# Patient Record
Sex: Female | Born: 2000 | Hispanic: No | Marital: Single | Smoking: Current every day smoker
Health system: Southern US, Community
[De-identification: ages and names within clinical notes are randomized; demographics above are authoritative.]

---
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
## 2018-01-28 ENCOUNTER — Ambulatory Visit (INDEPENDENT_AMBULATORY_CARE_PROVIDER_SITE_OTHER): Payer: Managed Care, Other (non HMO)

## 2018-01-28 ENCOUNTER — Encounter (HOSPITAL_COMMUNITY): Payer: Self-pay | Admitting: Emergency Medicine

## 2018-01-28 ENCOUNTER — Ambulatory Visit (HOSPITAL_COMMUNITY)
Admission: EM | Admit: 2018-01-28 | Discharge: 2018-01-28 | Disposition: A | Discharge status: Home / Self Care | Payer: Managed Care, Other (non HMO) | Attending: Family Medicine | Admitting: Family Medicine

## 2018-01-28 ENCOUNTER — Ambulatory Visit: Payer: Self-pay

## 2018-01-28 DIAGNOSIS — K5900 Constipation, unspecified: Secondary | ICD-10-CM | POA: Diagnosis not present

## 2018-01-28 MED ORDER — MAGNESIUM CITRATE PO SOLN: 1.0000 | Freq: Once | ORAL | 1 refills | Status: AC

## 2018-01-28 NOTE — ED Provider Notes (Signed)
[facility]    CSN: [id number] Arrival date & time: 01/28/18  1502     History   Chief Complaint Chief Complaint  Patient presents with  . Abdominal Pain    HPI Roberta Mills is a 17 y.o. female.   Patient is a 18 year old female that presents with worsening right upper quadrant pain over the past week.  She has had some associated nausea and one episode of diarrhea this morning.  The diarrhea was soft and semi-liquidy.  She denies any rectal bleeding.  She has had a hard time having a bowel movement over the past week.  The pain is there constantly and it is hard for her to get comfortable.  She denies any vomiting.  Reports some fever and chills this past weekend.   ROS per HPI      History reviewed. No pertinent past medical history.  There are no active problems to display for this patient.   History reviewed. No pertinent surgical history.  OB History   None      Home Medications    Prior to Admission medications   Medication Sig Start Date End Date Taking? Authorizing Provider  cephALEXin (KEFLEX) 500 MG capsule Take 1 capsule (500 mg total) by mouth 4 (four) times daily. Patient not taking: Reported on 01/28/2018 12/25/17   Janace Aris, NP    Family History Family History  Problem Relation Age of Onset  . Healthy Mother   . Healthy Father     Social History Social History   Tobacco Use  . Smoking status: Current Every Day Smoker  . Smokeless tobacco: Current User  Substance Use Topics  . Alcohol use: Yes  . Drug use: Never     Allergies   Peanut oil and Shellfish allergy   Review of Systems Review of Systems   Physical Exam Triage Vital Signs ED Triage Vitals [01/28/18 1548]  Enc Vitals Group     BP 125/66     Pulse Rate 60     Resp 18     Temp 97.8 F (36.6 C)     Temp src      SpO2 100 %     Weight      Height      Head Circumference      Peak Flow      Pain Score      Pain Loc      Pain Edu?      Excl. in  GC?    No data found.  Updated Vital Signs BP 125/66   Pulse 60   Temp 97.8 F (36.6 C)   Resp 18   LMP 01/07/2018   SpO2 100%   Visual Acuity Right Eye Distance:   Left Eye Distance:   Bilateral Distance:    Right Eye Near:   Left Eye Near:    Bilateral Near:     Physical Exam  Constitutional: She is oriented to person, place, and time. She appears well-developed and well-nourished.  Very pleasant. Non toxic or ill appearing.     HENT:  Head: Normocephalic and atraumatic.  Cardiovascular: Normal rate and regular rhythm.  Pulmonary/Chest: Effort normal.  Abdominal: Soft. Normal appearance. Bowel sounds are decreased. There is no hepatosplenomegaly, splenomegaly or hepatomegaly. There is tenderness in the right upper quadrant. There is no rigidity, no rebound, no guarding, no CVA tenderness, no tenderness at McBurney's point and negative Murphy's sign. No hernia.  Very tender to palpation of right upper quadrant  and right mid abdomen.  No right lower quadrant pain or rebound tenderness.  No obvious masses  Neurological: She is alert and oriented to person, place, and time.  Skin: Skin is warm and dry.  Psychiatric: She has a normal mood and affect. Her behavior is normal.  Nursing note and vitals reviewed.    UC Treatments / Results  Labs (all labs ordered are listed, but only abnormal results are displayed) Labs Reviewed - No data to display  EKG None  Radiology Dg Abd 1 View  Result Date: 01/28/2018 CLINICAL DATA:  Right upper quadrant abdominal pain since this morning with diarrhea. EXAM: ABDOMEN - 1 VIEW COMPARISON:  None. FINDINGS: Normal bowel gas pattern. Left pelvic phleboliths. Mild dextroconvex thoracolumbar scoliosis. IMPRESSION: No acute abnormality. Electronically Signed   By: Beckie Salts M.D.   On: 01/28/2018 17:04    Procedures Procedures (including critical care time)  Medications Ordered in UC Medications - No data to display  Initial  Impression / Assessment and Plan / UC Course  I have reviewed the triage vital signs and the nursing notes.  Pertinent labs & imaging results that were available during my care of the patient were reviewed by me and considered in my medical decision making (see chart for details).     X-ray revealed constipation. Will treat with mag citrate. Follow up as needed for continued or worsening symptoms  Final Clinical Impressions(s) / UC Diagnoses   Final diagnoses:  Constipation, unspecified constipation type     Discharge Instructions     It was nice meeting you!!  Your x ray showed constipation .  I am prescribing magnesium citrate for your symptoms. Follow up as needed for continued or worsening symptoms     ED Prescriptions    Medication Sig Dispense Auth. Provider   magnesium citrate SOLN Take 296 mLs (1 Bottle total) by mouth once for 1 dose. 195 mL Dahlia Byes A, NP     Controlled Substance Prescriptions [place] Controlled Substance Registry consulted? Not Applicable   Janace Aris, NP 01/29/18 1133    Isa Rankin, MD 02/10/18 1531

## 2018-01-28 NOTE — Telephone Encounter (Signed)
  Pt called with C/O rt side abdominal pain that she rates as moderate. Hx given is that  she has had some off and on symptoms for about a week. Today pain is constant and gets worse with movement. Pt has had a BM today which she called normal but trying to be diarrhea. No other BM today. She denies vomiting but dose say see has nausea. She was directed to go to the urgent care of choice to be seen today. Pt agreed to disposition. Care advice given Pt verbalized understanding of all.  Reason for Disposition . [1] MILD-MODERATE pain AND [2] constant AND [3] present > 2 hours    Established care visit scheduled 02/11/18  Pt will go to urgent care for evaluation and treatment of her symptoms.  Answer Assessment - Initial Assessment Questions 1. LOCATION: "Where does it hurt?"      Rt side over ribs 2. RADIATION: "Does the pain shoot anywhere else?" (e.g., chest, back)     no 3. ONSET: "When did the pain begin?" (e.g., minutes, hours or days ago)      Yesterday severe off and on for 1 week 4. SUDDEN: "Gradual or sudden onset?"     Sudden went to severe 5. PATTERN "Does the pain come and go, or is it constant?"    - If constant: "Is it getting better, staying the same, or worsening?"      (Note: Constant means the pain never goes away completely; most serious pain is constant and it progresses)     - If intermittent: "How long does it last?" "Do you have pain now?"     (Note: Intermittent means the pain goes away completely between bouts)     worse with movement 6. SEVERITY: "How bad is the pain?"  (e.g., Scale 1-10; mild, moderate, or severe)   - MILD (1-3): doesn't interfere with normal activities, abdomen soft and not tender to touch    - MODERATE (4-7): interferes with normal activities or awakens from sleep, tender to touch    - SEVERE (8-10): excruciating pain, doubled over, unable to do any normal activities     Moderate hurts with walking 7. RECURRENT SYMPTOM: "Have you ever had this type  of abdominal pain before?" If so, ask: "When was the last time?" and "What happened that time?"      no 8. CAUSE: "What do you think is causing the abdominal pain?    unknown 9. RELIEVING/AGGRAVATING FACTORS: "What makes it better or worse?" (e.g., movement, antacids, bowel movement)     This am BM normal no relief reported from pain 10. OTHER SYMPTOMS: "Has there been any vomiting, diarrhea, constipation, or urine problems?"       Feels nauseated 11. PREGNANCY: "Is there any chance you are pregnant?" "When was your last menstrual period?"       3 weeks ago Denies pregnancy  Protocols used: ABDOMINAL PAIN - [facility]

## 2018-01-28 NOTE — ED Triage Notes (Signed)
Pt c/o RUQ abdominal pain x1 week. Had some diarrhea this morning.

## 2018-01-28 NOTE — Discharge Instructions (Signed)
It was nice meeting you!!  Your x ray showed constipation .  I am prescribing magnesium citrate for your symptoms. Follow up as needed for continued or worsening symptoms

## 2018-02-11 ENCOUNTER — Ambulatory Visit: Payer: 59 | Admitting: Emergency Medicine

## 2018-02-11 ENCOUNTER — Encounter: Payer: Self-pay | Admitting: Emergency Medicine

## 2018-02-11 VITALS — BP 136/76 | HR 62 | Temp 97.9°F | Resp 16 | Ht 67.0 in | Wt 171.0 lb

## 2018-02-11 DIAGNOSIS — F329 Major depressive disorder, single episode, unspecified: Secondary | ICD-10-CM

## 2018-02-11 DIAGNOSIS — Z0000 Encounter for general adult medical examination without abnormal findings: Secondary | ICD-10-CM

## 2018-02-11 DIAGNOSIS — Z23 Encounter for immunization: Secondary | ICD-10-CM

## 2018-02-11 DIAGNOSIS — R4589 Other symptoms and signs involving emotional state: Secondary | ICD-10-CM

## 2018-02-11 NOTE — Patient Instructions (Addendum)
If you have lab work done today you will be contacted with your lab results within the next 2 weeks.  If you have not heard from [place] then please contact us. The fastest way to get your results is to register for My Chart.   IF you received an x-ray today, you will receive an invoice from [facility]. Please contact [facility] at [phone] with questions or concerns regarding your invoice.   IF you received labwork today, you will receive an invoice from [place]. Please contact [facility] at [phone] with questions or concerns regarding your invoice.   Our billing staff will not be able to assist you with questions regarding bills from these companies.  You will be contacted with the lab results as soon as they are available. The fastest way to get your results is to activate your My Chart account. Instructions are located on the last page of this paperwork. If you have not heard from [place] regarding the results in 2 weeks, please contact this office.     Health Maintenance, Female Adopting a healthy lifestyle and getting preventive care can go a long way to promote health and wellness. Talk with your health care provider about what schedule of regular examinations is right for you. This is a good chance for you to check in with your provider about disease prevention and staying healthy. In between checkups, there are plenty of things you can do on your own. Experts have done a lot of research about which lifestyle changes and preventive measures are most likely to keep you healthy. Ask your health care provider for more information. Weight and diet Eat a healthy diet  Be sure to include plenty of vegetables, fruits, low-fat dairy products, and lean protein.  Do not eat a lot of foods high in solid fats, added sugars, or salt.  Get regular exercise. This is one of the most important things you can do for your health. ? Most adults should exercise for at least 150  minutes each week. The exercise should increase your heart rate and make you sweat (moderate-intensity exercise). ? Most adults should also do strengthening exercises at least twice a week. This is in addition to the moderate-intensity exercise.  Maintain a healthy weight  Body mass index (BMI) is a measurement that can be used to identify possible weight problems. It estimates body fat based on height and weight. Your health care provider can help determine your BMI and help you achieve or maintain a healthy weight.  For females 22 years of age and older: ? A BMI below 18.5 is considered underweight. ? A BMI of 18.5 to 24.9 is normal. ? A BMI of 25 to 29.9 is considered overweight. ? A BMI of 30 and above is considered obese.  Watch levels of cholesterol and blood lipids  You should start having your blood tested for lipids and cholesterol at 19 years of age, then have this test every 5 years.  You may need to have your cholesterol levels checked more often if: ? Your lipid or cholesterol levels are high. ? You are older than 19 years of age. ? You are at high risk for heart disease.  Cancer screening Lung Cancer  Lung cancer screening is recommended for adults 58-34 years old who are at high risk for lung cancer because of a history of smoking.  A yearly low-dose CT scan of the lungs is recommended for people who: ? Currently smoke. ? Have quit  within the past 15 years. ? Have at least a 30-pack-year history of smoking. A pack year is smoking an average of one pack of cigarettes a day for 1 year.  Yearly screening should continue until it has been 15 years since you quit.  Yearly screening should stop if you develop a health problem that would prevent you from having lung cancer treatment.  Breast Cancer  Practice breast self-awareness. This means understanding how your breasts normally appear and feel.  It also means doing regular breast self-exams. Let your health care  provider know about any changes, no matter how small.  If you are in your 20s or 30s, you should have a clinical breast exam (CBE) by a health care provider every 1-3 years as part of a regular health exam.  If you are 48 or older, have a CBE every year. Also consider having a breast X-ray (mammogram) every year.  If you have a family history of breast cancer, talk to your health care provider about genetic screening.  If you are at high risk for breast cancer, talk to your health care provider about having an MRI and a mammogram every year.  Breast cancer gene (BRCA) assessment is recommended for women who have family members with BRCA-related cancers. BRCA-related cancers include: ? Breast. ? Ovarian. ? Tubal. ? Peritoneal cancers.  Results of the assessment will determine the need for genetic counseling and BRCA1 and BRCA2 testing.  Cervical Cancer Your health care provider may recommend that you be screened regularly for cancer of the pelvic organs (ovaries, uterus, and vagina). This screening involves a pelvic examination, including checking for microscopic changes to the surface of your cervix (Pap test). You may be encouraged to have this screening done every 3 years, beginning at age 8.  For women ages 8-65, health care providers may recommend pelvic exams and Pap testing every 3 years, or they may recommend the Pap and pelvic exam, combined with testing for human papilloma virus (HPV), every 5 years. Some types of HPV increase your risk of cervical cancer. Testing for HPV may also be done on women of any age with unclear Pap test results.  Other health care providers may not recommend any screening for nonpregnant women who are considered low risk for pelvic cancer and who do not have symptoms. Ask your health care provider if a screening pelvic exam is right for you.  If you have had past treatment for cervical cancer or a condition that could lead to cancer, you need Pap tests  and screening for cancer for at least 20 years after your treatment. If Pap tests have been discontinued, your risk factors (such as having a new sexual partner) need to be reassessed to determine if screening should resume. Some women have medical problems that increase the chance of getting cervical cancer. In these cases, your health care provider may recommend more frequent screening and Pap tests.  Colorectal Cancer  This type of cancer can be detected and often prevented.  Routine colorectal cancer screening usually begins at 19 years of age and continues through 19 years of age.  Your health care provider may recommend screening at an earlier age if you have risk factors for colon cancer.  Your health care provider may also recommend using home test kits to check for hidden blood in the stool.  A small camera at the end of a tube can be used to examine your colon directly (sigmoidoscopy or colonoscopy). This is done to check  for the earliest forms of colorectal cancer.  Routine screening usually begins at age 75.  Direct examination of the colon should be repeated every 5-10 years through 19 years of age. However, you may need to be screened more often if early forms of precancerous polyps or small growths are found.  Skin Cancer  Check your skin from head to toe regularly.  Tell your health care provider about any new moles or changes in moles, especially if there is a change in a mole's shape or color.  Also tell your health care provider if you have a mole that is larger than the size of a pencil eraser.  Always use sunscreen. Apply sunscreen liberally and repeatedly throughout the day.  Protect yourself by wearing long sleeves, pants, a wide-brimmed hat, and sunglasses whenever you are outside.  Heart disease, diabetes, and high blood pressure  High blood pressure causes heart disease and increases the risk of stroke. High blood pressure is more likely to develop  in: ? People who have blood pressure in the high end of the normal range (130-139/85-89 mm Hg). ? People who are overweight or obese. ? People who are African American.  If you are 41-67 years of age, have your blood pressure checked every 3-5 years. If you are 32 years of age or older, have your blood pressure checked every year. You should have your blood pressure measured twice-once when you are at a hospital or clinic, and once when you are not at a hospital or clinic. Record the average of the two measurements. To check your blood pressure when you are not at a hospital or clinic, you can use: ? An automated blood pressure machine at a pharmacy. ? A home blood pressure monitor.  If you are between 44 years and 70 years old, ask your health care provider if you should take aspirin to prevent strokes.  Have regular diabetes screenings. This involves taking a blood sample to check your fasting blood sugar level. ? If you are at a normal weight and have a low risk for diabetes, have this test once every three years after 19 years of age. ? If you are overweight and have a high risk for diabetes, consider being tested at a younger age or more often. Preventing infection Hepatitis B  If you have a higher risk for hepatitis B, you should be screened for this virus. You are considered at high risk for hepatitis B if: ? You were born in a country where hepatitis B is common. Ask your health care provider which countries are considered high risk. ? Your parents were born in a high-risk country, and you have not been immunized against hepatitis B (hepatitis B vaccine). ? You have HIV or AIDS. ? You use needles to inject street drugs. ? You live with someone who has hepatitis B. ? You have had sex with someone who has hepatitis B. ? You get hemodialysis treatment. ? You take certain medicines for conditions, including cancer, organ transplantation, and autoimmune conditions.  Hepatitis C  Blood  testing is recommended for: ? Everyone born from [birth year] through 1967. ? Anyone with known risk factors for hepatitis C.  Sexually transmitted infections (STIs)  You should be screened for sexually transmitted infections (STIs) including gonorrhea and chlamydia if: ? You are sexually active and are younger than 19 years of age. ? You are older than 19 years of age and your health care provider tells you that you are at risk for  this type of infection. ? Your sexual activity has changed since you were last screened and you are at an increased risk for chlamydia or gonorrhea. Ask your health care provider if you are at risk.  If you do not have HIV, but are at risk, it may be recommended that you take a prescription medicine daily to prevent HIV infection. This is called pre-exposure prophylaxis (PrEP). You are considered at risk if: ? You are sexually active and do not regularly use condoms or know the HIV status of your partner(s). ? You take drugs by injection. ? You are sexually active with a partner who has HIV.  Talk with your health care provider about whether you are at high risk of being infected with HIV. If you choose to begin PrEP, you should first be tested for HIV. You should then be tested every 3 months for as long as you are taking PrEP. Pregnancy  If you are premenopausal and you may become pregnant, ask your health care provider about preconception counseling.  If you may become pregnant, take 400 to 800 micrograms (mcg) of folic acid every day.  If you want to prevent pregnancy, talk to your health care provider about birth control (contraception). Osteoporosis and menopause  Osteoporosis is a disease in which the bones lose minerals and strength with aging. This can result in serious bone fractures. Your risk for osteoporosis can be identified using a bone density scan.  If you are 68 years of age or older, or if you are at risk for osteoporosis and fractures, ask your  health care provider if you should be screened.  Ask your health care provider whether you should take a calcium or vitamin D supplement to lower your risk for osteoporosis.  Menopause may have certain physical symptoms and risks.  Hormone replacement therapy may reduce some of these symptoms and risks. Talk to your health care provider about whether hormone replacement therapy is right for you. Follow these instructions at home:  Schedule regular health, dental, and eye exams.  Stay current with your immunizations.  Do not use any tobacco products including cigarettes, chewing tobacco, or electronic cigarettes.  If you are pregnant, do not drink alcohol.  If you are breastfeeding, limit how much and how often you drink alcohol.  Limit alcohol intake to no more than 1 drink per day for nonpregnant women. One drink equals 12 ounces of beer, 5 ounces of wine, or 1 ounces of hard liquor.  Do not use street drugs.  Do not share needles.  Ask your health care provider for help if you need support or information about quitting drugs.  Tell your health care provider if you often feel depressed.  Tell your health care provider if you have ever been abused or do not feel safe at home. This information is not intended to replace advice given to you by your health care provider. Make sure you discuss any questions you have with your health care provider. Document Released: 11/05/2010 Document Revised: 09/28/2015 Document Reviewed: 01/24/2015 [employer] Interactive Patient Education  [employer].

## 2018-02-11 NOTE — Progress Notes (Signed)
Roberta Mills 17 y.o.   Chief Complaint  Patient presents with  . Annual Exam  . Depression    per triage    HISTORY OF PRESENT ILLNESS: This is a 17 y.o. female here for annual exam. Has no chronic medical problems.  On no chronic medications. States she has been depressed for about a month now.   Depression screen PHQ 2/9 02/11/2018  Decreased Interest 1  Down, Depressed, Hopeless 1  PHQ - 2 Score 2  Altered sleeping 3  Tired, decreased energy 3  Change in appetite 3  Feeling bad or failure about yourself  3  Trouble concentrating 0  Moving slowly or fidgety/restless 0  Suicidal thoughts 0  PHQ-9 Score 14  Difficult doing work/chores Somewhat difficult   Reports lack of motivation. Sexually active.  Saw her GYN doctor 2 years ago and everything was okay.  Inquiring about birth control. Was seen at the urgent care center about 2 weeks ago due to pain in her right upper abdomen.  Had normal x-rays.  No blood work done.  Uncertain etiology of the pain.  Pain is worse when she moves.  Eating and drinking well.  Denies nausea or vomiting.  No other associated symptoms.  No history of gallbladder disease.  HPI   Prior to Admission medications   Not on File    Allergies  Allergen Reactions  . Peanut Oil Anaphylaxis  . Shellfish Allergy Swelling    There are no active problems to display for this patient.   No past medical history on file.  No past surgical history on file.  Social History   Socioeconomic History  . Marital status: Single    Spouse name: Not on file  . Number of children: Not on file  . Years of education: Not on file  . Highest education level: Not on file  Occupational History  . Not on file  Social Needs  . Financial resource strain: Not on file  . Food insecurity:    Worry: Not on file    Inability: Not on file  . Transportation needs:    Medical: Not on file    Non-medical: Not on file  Tobacco Use  . Smoking status: Current Every  Day Smoker  . Smokeless tobacco: Current User  Substance and Sexual Activity  . Alcohol use: Yes  . Drug use: Never  . Sexual activity: Not on file  Lifestyle  . Physical activity:    Days per week: Not on file    Minutes per session: Not on file  . Stress: Not on file  Relationships  . Social connections:    Talks on phone: Not on file    Gets together: Not on file    Attends religious service: Not on file    Active member of club or organization: Not on file    Attends meetings of clubs or organizations: Not on file    Relationship status: Not on file  . Intimate partner violence:    Fear of current or ex partner: Not on file    Emotionally abused: Not on file    Physically abused: Not on file    Forced sexual activity: Not on file  Other Topics Concern  . Not on file  Social History Narrative  . Not on file    Family History  Problem Relation Age of Onset  . Healthy Mother   . Healthy Father      Review of Systems  Constitutional: Negative.  Negative for chills, fever and weight loss.  HENT: Negative.  Negative for congestion, nosebleeds and sore throat.   Eyes: Negative.  Negative for blurred vision and double vision.  Respiratory: Negative.  Negative for cough and shortness of breath.   Cardiovascular: Negative.  Negative for chest pain, palpitations and leg swelling.  Gastrointestinal: Positive for abdominal pain. Negative for diarrhea, nausea and vomiting.  Genitourinary: Negative.  Negative for dysuria, frequency, hematuria and urgency.  Musculoskeletal: Negative.  Negative for back pain, myalgias and neck pain.  Skin: Negative.  Negative for rash.  Neurological: Negative.  Negative for dizziness, sensory change, weakness and headaches.  Endo/Heme/Allergies: Negative.   All other systems reviewed and are negative.   Vitals:   02/11/18 1002  BP: 136/76  Pulse: 62  Resp: 16  Temp: 97.9 F (36.6 C)  SpO2: 96%    Physical Exam  Constitutional: She is  oriented to person, place, and time. She appears well-developed and well-nourished.  HENT:  Head: Normocephalic and atraumatic.  Right Ear: External ear normal.  Left Ear: External ear normal.  Nose: Nose normal.  Mouth/Throat: Oropharynx is clear and moist.  Eyes: Pupils are equal, round, and reactive to light. Conjunctivae and EOM are normal.  Neck: Normal range of motion. Neck supple. No JVD present. No thyromegaly present.  Cardiovascular: Normal rate, regular rhythm, normal heart sounds and intact distal pulses.  Pulmonary/Chest: Effort normal and breath sounds normal.  Abdominal: Soft. Bowel sounds are normal. She exhibits no distension and no mass. There is no tenderness. There is no rebound and no guarding.  Musculoskeletal: Normal range of motion. She exhibits no edema.  Lymphadenopathy:    She has no cervical adenopathy.  Neurological: She is alert and oriented to person, place, and time. No sensory deficit. She exhibits normal muscle tone. Coordination normal.  Skin: Skin is warm and dry. Capillary refill takes less than 2 seconds.  Psychiatric: She has a normal mood and affect. Her behavior is normal.  Vitals reviewed.    ASSESSMENT & PLAN: Roberta Mills was seen today for annual exam and depression.  Diagnoses and all orders for this visit:  Routine general medical examination at a health care facility -     CBC with Differential -     Comprehensive metabolic panel -     Ambulatory referral to Gynecology  Immunization due -     Flu Vaccine QUAD 36+ mos IM -     Cancel: Tdap vaccine greater than or equal to 7yo IM  Symptoms of depression -     Ambulatory referral to Psychiatry   Patient Instructions       If you have lab work done today you will be contacted with your lab results within the next 2 weeks.  If you have not heard from [place] then please contact us. The fastest way to get your results is to register for My Chart.   IF you received an x-ray today, you will  receive an invoice from [facility]. Please contact [facility] at [phone] with questions or concerns regarding your invoice.   IF you received labwork today, you will receive an invoice from [facility]. Please contact [facility] at [phone] with questions or concerns regarding your invoice.   Our billing staff will not be able to assist you with questions regarding bills from these companies.  You will be contacted with the lab results as soon as they are available. The fastest way to get your results is to activate your My  Chart account. Instructions are located on the last page of this paperwork. If you have not heard from [place] regarding the results in 2 weeks, please contact this office.     Health Maintenance, Female Adopting a healthy lifestyle and getting preventive care can go a long way to promote health and wellness. Talk with your health care provider about what schedule of regular examinations is right for you. This is a good chance for you to check in with your provider about disease prevention and staying healthy. In between checkups, there are plenty of things you can do on your own. Experts have done a lot of research about which lifestyle changes and preventive measures are most likely to keep you healthy. Ask your health care provider for more information. Weight and diet Eat a healthy diet  Be sure to include plenty of vegetables, fruits, low-fat dairy products, and lean protein.  Do not eat a lot of foods high in solid fats, added sugars, or salt.  Get regular exercise. This is one of the most important things you can do for your health. ? Most adults should exercise for at least 150 minutes each week. The exercise should increase your heart rate and make you sweat (moderate-intensity exercise). ? Most adults should also do strengthening exercises at least twice a week. This is in addition to the moderate-intensity exercise.  Maintain a healthy  weight  Body mass index (BMI) is a measurement that can be used to identify possible weight problems. It estimates body fat based on height and weight. Your health care provider can help determine your BMI and help you achieve or maintain a healthy weight.  For females 85 years of age and older: ? A BMI below 18.5 is considered underweight. ? A BMI of 18.5 to 24.9 is normal. ? A BMI of 25 to 29.9 is considered overweight. ? A BMI of 30 and above is considered obese.  Watch levels of cholesterol and blood lipids  You should start having your blood tested for lipids and cholesterol at 19 years of age, then have this test every 5 years.  You may need to have your cholesterol levels checked more often if: ? Your lipid or cholesterol levels are high. ? You are older than 19 years of age. ? You are at high risk for heart disease.  Cancer screening Lung Cancer  Lung cancer screening is recommended for adults 69-48 years old who are at high risk for lung cancer because of a history of smoking.  A yearly low-dose CT scan of the lungs is recommended for people who: ? Currently smoke. ? Have quit within the past 15 years. ? Have at least a 30-pack-year history of smoking. A pack year is smoking an average of one pack of cigarettes a day for 1 year.  Yearly screening should continue until it has been 15 years since you quit.  Yearly screening should stop if you develop a health problem that would prevent you from having lung cancer treatment.  Breast Cancer  Practice breast self-awareness. This means understanding how your breasts normally appear and feel.  It also means doing regular breast self-exams. Let your health care provider know about any changes, no matter how small.  If you are in your 20s or 30s, you should have a clinical breast exam (CBE) by a health care provider every 1-3 years as part of a regular health exam.  If you are 54 or older, have a CBE every year. Also consider  having a breast X-ray (mammogram) every year.  If you have a family history of breast cancer, talk to your health care provider about genetic screening.  If you are at high risk for breast cancer, talk to your health care provider about having an MRI and a mammogram every year.  Breast cancer gene (BRCA) assessment is recommended for women who have family members with BRCA-related cancers. BRCA-related cancers include: ? Breast. ? Ovarian. ? Tubal. ? Peritoneal cancers.  Results of the assessment will determine the need for genetic counseling and BRCA1 and BRCA2 testing.  Cervical Cancer Your health care provider may recommend that you be screened regularly for cancer of the pelvic organs (ovaries, uterus, and vagina). This screening involves a pelvic examination, including checking for microscopic changes to the surface of your cervix (Pap test). You may be encouraged to have this screening done every 3 years, beginning at age 2.  For women ages 72-65, health care providers may recommend pelvic exams and Pap testing every 3 years, or they may recommend the Pap and pelvic exam, combined with testing for human papilloma virus (HPV), every 5 years. Some types of HPV increase your risk of cervical cancer. Testing for HPV may also be done on women of any age with unclear Pap test results.  Other health care providers may not recommend any screening for nonpregnant women who are considered low risk for pelvic cancer and who do not have symptoms. Ask your health care provider if a screening pelvic exam is right for you.  If you have had past treatment for cervical cancer or a condition that could lead to cancer, you need Pap tests and screening for cancer for at least 20 years after your treatment. If Pap tests have been discontinued, your risk factors (such as having a new sexual partner) need to be reassessed to determine if screening should resume. Some women have medical problems that increase  the chance of getting cervical cancer. In these cases, your health care provider may recommend more frequent screening and Pap tests.  Colorectal Cancer  This type of cancer can be detected and often prevented.  Routine colorectal cancer screening usually begins at 19 years of age and continues through 19 years of age.  Your health care provider may recommend screening at an earlier age if you have risk factors for colon cancer.  Your health care provider may also recommend using home test kits to check for hidden blood in the stool.  A small camera at the end of a tube can be used to examine your colon directly (sigmoidoscopy or colonoscopy). This is done to check for the earliest forms of colorectal cancer.  Routine screening usually begins at age 32.  Direct examination of the colon should be repeated every 5-10 years through 19 years of age. However, you may need to be screened more often if early forms of precancerous polyps or small growths are found.  Skin Cancer  Check your skin from head to toe regularly.  Tell your health care provider about any new moles or changes in moles, especially if there is a change in a mole's shape or color.  Also tell your health care provider if you have a mole that is larger than the size of a pencil eraser.  Always use sunscreen. Apply sunscreen liberally and repeatedly throughout the day.  Protect yourself by wearing long sleeves, pants, a wide-brimmed hat, and sunglasses whenever you are outside.  Heart disease, diabetes, and high blood pressure  High blood pressure causes heart disease and increases the risk of stroke. High blood pressure is more likely to develop in: ? People who have blood pressure in the high end of the normal range (130-139/85-89 mm Hg). ? People who are overweight or obese. ? People who are African American.  If you are 58-31 years of age, have your blood pressure checked every 3-5 years. If you are 86 years of age  or older, have your blood pressure checked every year. You should have your blood pressure measured twice-once when you are at a hospital or clinic, and once when you are not at a hospital or clinic. Record the average of the two measurements. To check your blood pressure when you are not at a hospital or clinic, you can use: ? An automated blood pressure machine at a pharmacy. ? A home blood pressure monitor.  If you are between 74 years and 41 years old, ask your health care provider if you should take aspirin to prevent strokes.  Have regular diabetes screenings. This involves taking a blood sample to check your fasting blood sugar level. ? If you are at a normal weight and have a low risk for diabetes, have this test once every three years after 19 years of age. ? If you are overweight and have a high risk for diabetes, consider being tested at a younger age or more often. Preventing infection Hepatitis B  If you have a higher risk for hepatitis B, you should be screened for this virus. You are considered at high risk for hepatitis B if: ? You were born in a country where hepatitis B is common. Ask your health care provider which countries are considered high risk. ? Your parents were born in a high-risk country, and you have not been immunized against hepatitis B (hepatitis B vaccine). ? You have HIV or AIDS. ? You use needles to inject street drugs. ? You live with someone who has hepatitis B. ? You have had sex with someone who has hepatitis B. ? You get hemodialysis treatment. ? You take certain medicines for conditions, including cancer, organ transplantation, and autoimmune conditions.  Hepatitis C  Blood testing is recommended for: ? Everyone born from [birth year] through 1967. ? Anyone with known risk factors for hepatitis C.  Sexually transmitted infections (STIs)  You should be screened for sexually transmitted infections (STIs) including gonorrhea and chlamydia if: ? You are  sexually active and are younger than 19 years of age. ? You are older than 19 years of age and your health care provider tells you that you are at risk for this type of infection. ? Your sexual activity has changed since you were last screened and you are at an increased risk for chlamydia or gonorrhea. Ask your health care provider if you are at risk.  If you do not have HIV, but are at risk, it may be recommended that you take a prescription medicine daily to prevent HIV infection. This is called pre-exposure prophylaxis (PrEP). You are considered at risk if: ? You are sexually active and do not regularly use condoms or know the HIV status of your partner(s). ? You take drugs by injection. ? You are sexually active with a partner who has HIV.  Talk with your health care provider about whether you are at high risk of being infected with HIV. If you choose to begin PrEP, you should first be tested for HIV. You should then be tested every 3 months  for as long as you are taking PrEP. Pregnancy  If you are premenopausal and you may become pregnant, ask your health care provider about preconception counseling.  If you may become pregnant, take 400 to 800 micrograms (mcg) of folic acid every day.  If you want to prevent pregnancy, talk to your health care provider about birth control (contraception). Osteoporosis and menopause  Osteoporosis is a disease in which the bones lose minerals and strength with aging. This can result in serious bone fractures. Your risk for osteoporosis can be identified using a bone density scan.  If you are 84 years of age or older, or if you are at risk for osteoporosis and fractures, ask your health care provider if you should be screened.  Ask your health care provider whether you should take a calcium or vitamin D supplement to lower your risk for osteoporosis.  Menopause may have certain physical symptoms and risks.  Hormone replacement therapy may reduce some  of these symptoms and risks. Talk to your health care provider about whether hormone replacement therapy is right for you. Follow these instructions at home:  Schedule regular health, dental, and eye exams.  Stay current with your immunizations.  Do not use any tobacco products including cigarettes, chewing tobacco, or electronic cigarettes.  If you are pregnant, do not drink alcohol.  If you are breastfeeding, limit how much and how often you drink alcohol.  Limit alcohol intake to no more than 1 drink per day for nonpregnant women. One drink equals 12 ounces of beer, 5 ounces of wine, or 1 ounces of hard liquor.  Do not use street drugs.  Do not share needles.  Ask your health care provider for help if you need support or information about quitting drugs.  Tell your health care provider if you often feel depressed.  Tell your health care provider if you have ever been abused or do not feel safe at home. This information is not intended to replace advice given to you by your health care provider. Make sure you discuss any questions you have with your health care provider. Document Released: 11/05/2010 Document Revised: 09/28/2015 Document Reviewed: 01/24/2015 Elsevier Interactive Patient Education  2018 [employer]      Agustina Caroli, MD [facility]

## 2018-02-12 ENCOUNTER — Encounter: Payer: Self-pay | Admitting: Radiology

## 2018-02-12 LAB — COMPREHENSIVE METABOLIC PANEL
A/G RATIO: 1.5 (ref 1.2–2.2)
ALT: 8 IU/L (ref 0–32)
AST: 10 IU/L (ref 0–40)
Albumin: 4.3 g/dL (ref 3.5–5.5)
Alkaline Phosphatase: 75 IU/L (ref 39–117)
BUN/Creatinine Ratio: 12 (ref 9–23)
BUN: 9 mg/dL (ref 6–20)
Bilirubin Total: 0.2 mg/dL (ref 0.0–1.2)
CO2: 22 mmol/L (ref 20–29)
Calcium: 10.1 mg/dL (ref 8.7–10.2)
Chloride: 106 mmol/L (ref 96–106)
Creatinine, Ser: 0.75 mg/dL (ref 0.57–1.00)
GFR calc Af Amer: 134 mL/min/{1.73_m2} (ref >59)
GFR, EST NON AFRICAN AMERICAN: 116 mL/min/{1.73_m2} (ref >59)
GLOBULIN, TOTAL: 2.9 g/dL (ref 1.5–4.5)
Glucose: 99 mg/dL (ref 65–99)
POTASSIUM: 4.4 mmol/L (ref 3.5–5.2)
SODIUM: 140 mmol/L (ref 134–144)
Total Protein: 7.2 g/dL (ref 6.0–8.5)

## 2018-02-12 LAB — CBC WITH DIFFERENTIAL/PLATELET
BASOS: 0 %
Basophils Absolute: 0 10*3/uL (ref 0.0–0.2)
EOS (ABSOLUTE): 0.3 10*3/uL (ref 0.0–0.4)
EOS: 4 %
HEMATOCRIT: 35.6 % (ref 34.0–46.6)
Hemoglobin: 11.6 g/dL (ref 11.1–15.9)
Immature Grans (Abs): 0 10*3/uL (ref 0.0–0.1)
Immature Granulocytes: 0 %
Lymphocytes Absolute: 2.3 10*3/uL (ref 0.7–3.1)
Lymphs: 39 %
MCH: 27 pg (ref 26.6–33.0)
MCHC: 32.6 g/dL (ref 31.5–35.7)
MCV: 83 fL (ref 79–97)
MONOS ABS: 0.4 10*3/uL (ref 0.1–0.9)
Monocytes: 7 %
Neutrophils Absolute: 2.9 10*3/uL (ref 1.4–7.0)
Neutrophils: 50 %
Platelets: 336 10*3/uL (ref 150–450)
RBC: 4.3 x10E6/uL (ref 3.77–5.28)
RDW: 12.7 % (ref 12.3–15.4)
WBC: 5.9 10*3/uL (ref 3.4–10.8)

## 2019-07-09 IMAGING — DX DG ABDOMEN 1V
1 series · 1 of 1 positions shown · non-contrast
Comparison: None.

CLINICAL DATA: Right upper quadrant abdominal pain since this
morning with diarrhea.

EXAM:
ABDOMEN - 1 VIEW

[abdomen kub]
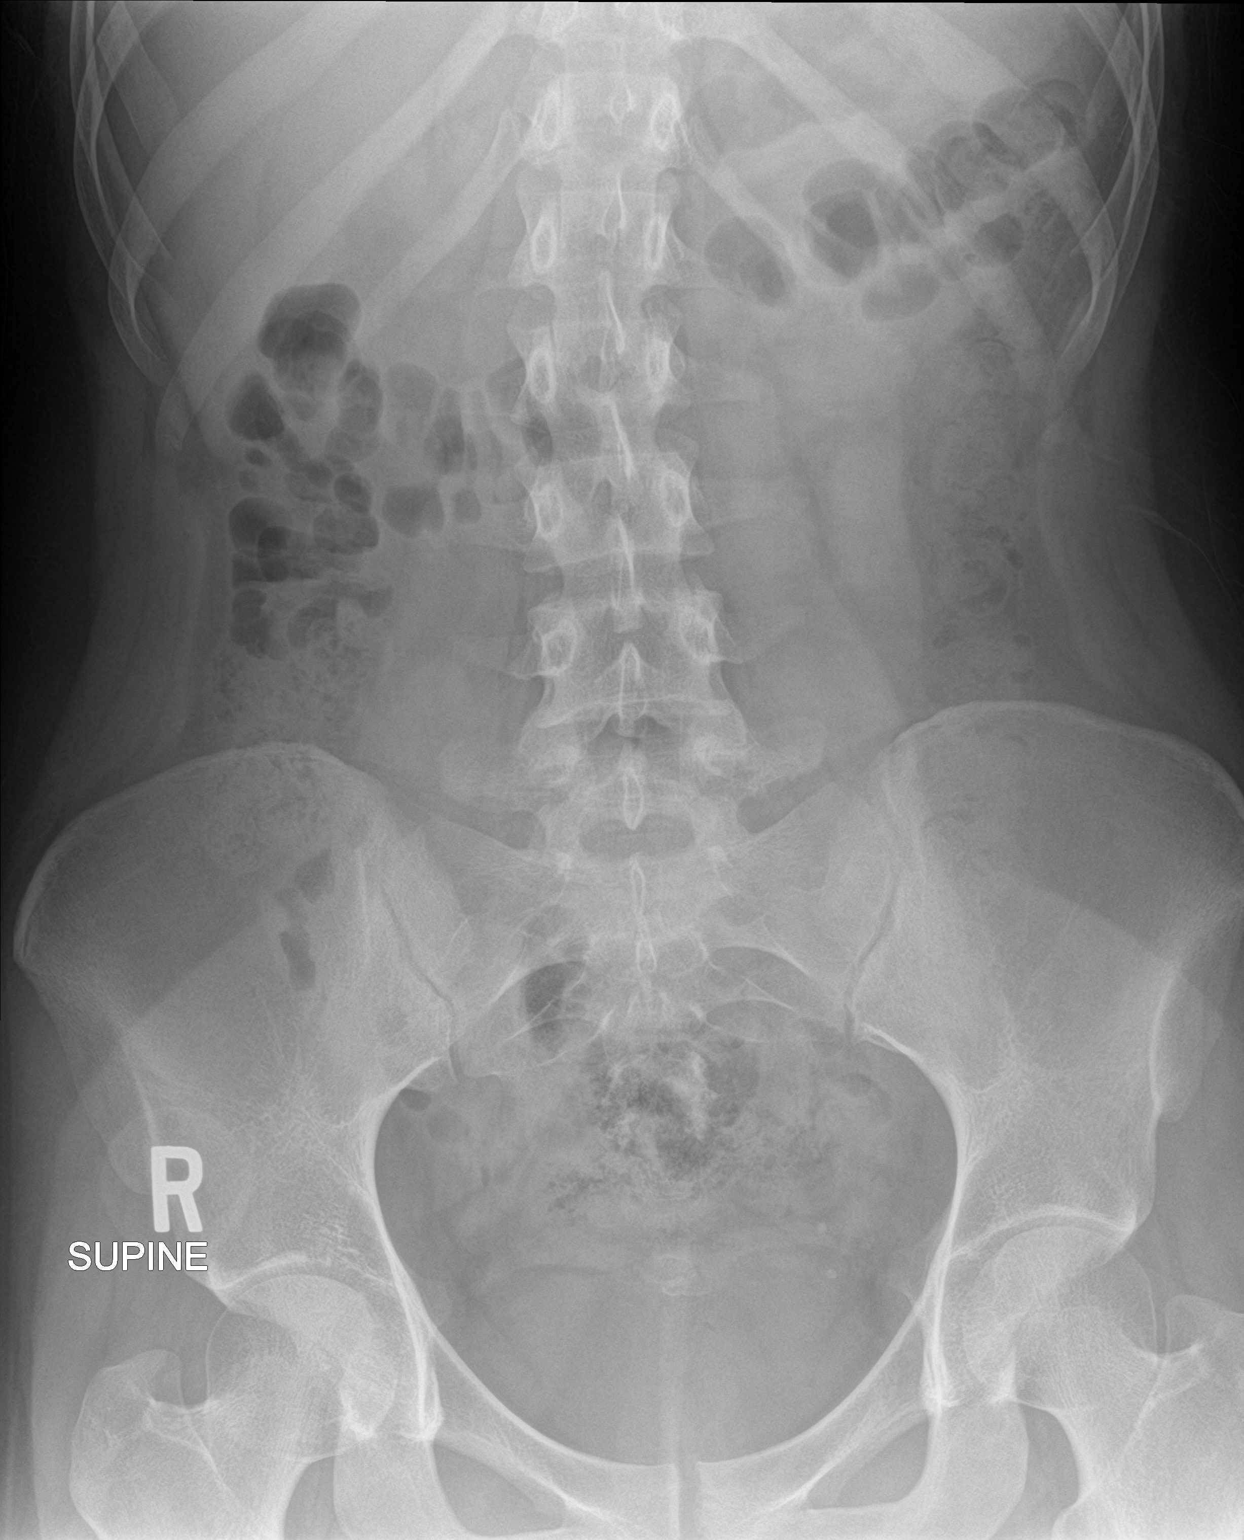

[1 of 1 positions shown; findings below may reference images not displayed]

FINDINGS: Normal bowel gas pattern. Left pelvic phleboliths. Mild dextroconvex
thoracolumbar scoliosis.
IMPRESSION: No acute abnormality.
